# Patient Record
Sex: Female | Born: 1980 | Race: Black or African American | Hispanic: No | Marital: Married | Smoking: Never smoker
Health system: Southern US, Community
[De-identification: ages and names within clinical notes are randomized; demographics above are authoritative.]

---

## 2000-09-12 ENCOUNTER — Emergency Department (HOSPITAL_COMMUNITY): Admission: EM | Admit: 2000-09-12 | Discharge: 2000-09-12 | Payer: Self-pay | Admitting: Emergency Medicine

## 2002-01-05 ENCOUNTER — Emergency Department (HOSPITAL_COMMUNITY): Admission: EM | Admit: 2002-01-05 | Discharge: 2002-01-05 | Payer: Self-pay | Admitting: Emergency Medicine

## 2004-01-17 ENCOUNTER — Inpatient Hospital Stay (HOSPITAL_COMMUNITY): Admission: AD | Admit: 2004-01-17 | Discharge: 2004-01-17 | Payer: Self-pay | Admitting: Obstetrics & Gynecology

## 2004-01-18 ENCOUNTER — Inpatient Hospital Stay (HOSPITAL_COMMUNITY): Admission: AD | Admit: 2004-01-18 | Discharge: 2004-01-18 | Payer: Self-pay | Admitting: Obstetrics and Gynecology

## 2004-02-03 ENCOUNTER — Inpatient Hospital Stay (HOSPITAL_COMMUNITY): Admission: AD | Admit: 2004-02-03 | Discharge: 2004-02-03 | Payer: Self-pay | Admitting: *Deleted

## 2004-02-19 ENCOUNTER — Inpatient Hospital Stay (HOSPITAL_COMMUNITY): Admission: AD | Admit: 2004-02-19 | Discharge: 2004-02-19 | Payer: Self-pay | Admitting: Family Medicine

## 2004-09-08 ENCOUNTER — Inpatient Hospital Stay (HOSPITAL_COMMUNITY): Admission: AD | Admit: 2004-09-08 | Discharge: 2004-09-08 | Payer: Self-pay | Admitting: Obstetrics & Gynecology

## 2004-09-09 ENCOUNTER — Inpatient Hospital Stay (HOSPITAL_COMMUNITY): Admission: AD | Admit: 2004-09-09 | Discharge: 2004-09-13 | Payer: Self-pay | Admitting: Obstetrics and Gynecology

## 2004-09-10 ENCOUNTER — Encounter (INDEPENDENT_AMBULATORY_CARE_PROVIDER_SITE_OTHER): Payer: Self-pay | Admitting: *Deleted

## 2004-10-14 ENCOUNTER — Other Ambulatory Visit: Admission: RE | Admit: 2004-10-14 | Discharge: 2004-10-14 | Payer: Self-pay | Admitting: Obstetrics & Gynecology

## 2004-12-04 ENCOUNTER — Emergency Department (HOSPITAL_COMMUNITY): Admission: EM | Admit: 2004-12-04 | Discharge: 2004-12-05 | Payer: Self-pay | Admitting: Emergency Medicine

## 2007-06-16 ENCOUNTER — Emergency Department (HOSPITAL_COMMUNITY): Admission: EM | Admit: 2007-06-16 | Discharge: 2007-06-16 | Payer: Self-pay | Admitting: Emergency Medicine

## 2007-10-19 ENCOUNTER — Emergency Department (HOSPITAL_COMMUNITY): Admission: EM | Admit: 2007-10-19 | Discharge: 2007-10-19 | Payer: Self-pay | Admitting: Emergency Medicine

## 2009-10-26 ENCOUNTER — Emergency Department (HOSPITAL_COMMUNITY): Admission: EM | Admit: 2009-10-26 | Discharge: 2009-10-26 | Payer: Self-pay | Admitting: Emergency Medicine

## 2010-11-19 ENCOUNTER — Ambulatory Visit (HOSPITAL_COMMUNITY)
Admission: RE | Admit: 2010-11-19 | Discharge: 2010-11-19 | Payer: Self-pay | Source: Home / Self Care | Attending: Obstetrics and Gynecology | Admitting: Obstetrics and Gynecology

## 2010-12-14 ENCOUNTER — Ambulatory Visit: Payer: Self-pay | Admitting: Oncology

## 2010-12-30 LAB — CBC WITH DIFFERENTIAL/PLATELET
BASO%: 1 % (ref 0.0–2.0)
Basophils Absolute: 0.1 10*3/uL (ref 0.0–0.1)
EOS%: 2.7 % (ref 0.0–7.0)
Eosinophils Absolute: 0.2 10*3/uL (ref 0.0–0.5)
HCT: 24.8 % — ABNORMAL LOW (ref 34.8–46.6)
HGB: 6.6 g/dL — CL (ref 11.6–15.9)
LYMPH%: 41.6 % (ref 14.0–49.7)
MCH: 16.9 pg — ABNORMAL LOW (ref 25.1–34.0)
MCHC: 26.6 g/dL — ABNORMAL LOW (ref 31.5–36.0)
MCV: 63.6 fL — ABNORMAL LOW (ref 79.5–101.0)
MONO#: 0.6 10*3/uL (ref 0.1–0.9)
MONO%: 9.3 % (ref 0.0–14.0)
NEUT#: 2.7 10*3/uL (ref 1.5–6.5)
NEUT%: 45.4 % (ref 38.4–76.8)
Platelets: 372 10*3/uL (ref 145–400)
RBC: 3.9 10*6/uL (ref 3.70–5.45)
WBC: 5.9 10*3/uL (ref 3.9–10.3)
lymph#: 2.5 10*3/uL (ref 0.9–3.3)
nRBC: 0 % (ref 0–0)

## 2010-12-30 LAB — TECHNOLOGIST REVIEW

## 2010-12-30 LAB — CHCC SMEAR

## 2011-01-03 LAB — IRON AND TIBC
%SAT: 9 % — ABNORMAL LOW (ref 20–55)
Iron: 39 ug/dL — ABNORMAL LOW (ref 42–145)
TIBC: 419 ug/dL (ref 250–470)
UIBC: 380 ug/dL

## 2011-01-03 LAB — COMPREHENSIVE METABOLIC PANEL
ALT: 10 U/L (ref 0–35)
AST: 15 U/L (ref 0–37)
Albumin: 4.4 g/dL (ref 3.5–5.2)
Alkaline Phosphatase: 42 U/L (ref 39–117)
BUN: 7 mg/dL (ref 6–23)
CO2: 24 mEq/L (ref 19–32)
Calcium: 9.2 mg/dL (ref 8.4–10.5)
Chloride: 104 mEq/L (ref 96–112)
Creatinine, Ser: 0.67 mg/dL (ref 0.40–1.20)
Glucose, Bld: 93 mg/dL (ref 70–99)
Potassium: 4.2 mEq/L (ref 3.5–5.3)
Sodium: 139 mEq/L (ref 135–145)
Total Bilirubin: 0.2 mg/dL — ABNORMAL LOW (ref 0.3–1.2)
Total Protein: 7 g/dL (ref 6.0–8.3)

## 2011-01-03 LAB — HEMOGLOBINOPATHY EVALUATION
Hgb A: 98.1 % — ABNORMAL HIGH (ref 96.8–97.8)
Hgb F Quant: 0 % (ref 0.0–2.0)
Hgb S Quant: 0 % (ref 0.0–0.0)

## 2011-01-03 LAB — FERRITIN: Ferritin: 4 ng/mL — ABNORMAL LOW (ref 10–291)

## 2011-02-21 LAB — TYPE AND SCREEN
ABO/RH(D): A POS
Antibody Screen: NEGATIVE
Unit division: 0
Unit division: 0

## 2011-02-21 LAB — BASIC METABOLIC PANEL
BUN: 6 mg/dL (ref 6–23)
Chloride: 109 mEq/L (ref 96–112)
GFR calc Af Amer: >60 mL/min (ref >60)
GFR calc non Af Amer: >60 mL/min (ref >60)
Potassium: 4.7 mEq/L (ref 3.5–5.1)
Sodium: 137 mEq/L (ref 135–145)

## 2011-02-21 LAB — CBC
HCT: 13.9 % — ABNORMAL LOW (ref 36.0–46.0)
HCT: 14.1 % — ABNORMAL LOW (ref 36.0–46.0)
Hemoglobin: 3.8 g/dL — CL (ref 12.0–15.0)
MCH: 14.5 pg — ABNORMAL LOW (ref 26.0–34.0)
MCH: 14.6 pg — ABNORMAL LOW (ref 26.0–34.0)
MCHC: 27.2 g/dL — ABNORMAL LOW (ref 30.0–36.0)
MCHC: 27.4 g/dL — ABNORMAL LOW (ref 30.0–36.0)
MCV: 53.3 fL — ABNORMAL LOW (ref 78.0–100.0)
MCV: 53.7 fL — ABNORMAL LOW (ref 78.0–100.0)
Platelets: 593 10*3/uL — ABNORMAL HIGH (ref 150–400)
RBC: 2.6 MIL/uL — ABNORMAL LOW (ref 3.87–5.11)
RDW: 21.5 % — ABNORMAL HIGH (ref 11.5–15.5)
WBC: 7.3 10*3/uL (ref 4.0–10.5)

## 2011-02-21 LAB — PREGNANCY, URINE: Preg Test, Ur: NEGATIVE

## 2011-02-21 LAB — ABO/RH: ABO/RH(D): A POS

## 2011-03-16 LAB — POCT URINALYSIS DIP (DEVICE)
Bilirubin Urine: NEGATIVE
Ketones, ur: NEGATIVE mg/dL
Protein, ur: NEGATIVE mg/dL
Specific Gravity, Urine: 1.015 (ref 1.005–1.030)
pH: 7 (ref 5.0–8.0)

## 2011-04-29 NOTE — Op Note (Signed)
[name], [name]               ACCOUNT NO.:  [id number]   MEDICAL RECORD NO.:  [id number]          PATIENT TYPE:  INP   LOCATION:  9147                          FACILITY:  [facility]   PHYSICIAN:  Guy Sandifer. Tomblin II, M.D.DATE OF BIRTH:  [birth year]   DATE OF PROCEDURE:  09/10/2004  DATE OF DISCHARGE:                                 OPERATIVE REPORT   PREOPERATIVE DIAGNOSES:  1.  Intrauterine pregnancy at 44 1/2 weeks estimated gestational age.  2.  Arrest of descent.  3.  Nonreassuring fetal heart tracing.   POSTOPERATIVE DIAGNOSES:  1.  Intrauterine pregnancy at 76 1/2 weeks estimated gestational age.  2.  Arrest of descent.  3.  Nonreassuring fetal heart tracing.   PROCEDURE:  Low transverse cesarean section.   SURGEON:  Guy Sandifer. Henderson Cloud, M.D.   ANESTHESIA:  Epidural, Burnett Corrente, M.D.   ESTIMATED BLOOD LOSS:  800 mL.   SPECIMENS:  Uterus.   FINDINGS:  Viable female infant, Apgar's of 4 and 9 minutes respectively.  Birth weight 6 pounds 10 ounces, arterial cord pH 7.24.   INDICATIONS FOR PROCEDURE:  The patient is a 30 year old black female, G1,  P0 with an EDC of September 06, 2004 who presents with complaints of uterine  contractions.  Group B beta strep culture is positive. The patient's  admitted to labor and delivery, has an epidural placed. Spontaneous rupture  of membranes is noted for meconium stained fluid. Fetal heart tones are  reactive. The patient progresses to complete in pushing. Upon beginning the  second stage, there are some variable shaped decelerations down into the  50's to 60's range. However, there is a good recovery between and these  resolved with position change to patient left. Cervical examination revealed  complete dilation and ROT at zero station.  Upon resuming pushing again, it  was noted there is no progress and there is recurrence of variable  decelerations again down into the 50's and 60's some with suspect late  components. A  recommendation for cesarean section is made. The potential  risks and complications are discussed with the patient including but not  limited to infection, bowel, bladder, ureteral damage, bleeding requiring  transfusion of blood products and possible transfusion reaction, HIV and  hepatitis acquisition, DVT, PE and pneumonia. All questions are answered and  consent is signed on the chart.   DESCRIPTION OF PROCEDURE:  The patient was taken to the operating room where  she was identified, epidural anesthetic is augmented to the surgical level  and she is placed in the dorsal supine position with a 15 degree left  lateral wedge. She is then prepped, there is a Foley catheter in the bladder  and she is draped in a sterile fashion. After adjusting for adequate  epidural anesthesia, the skin is entered through a Pfannenstiel incision and  dissection is carried out in layers to the peritoneum. The peritoneum is  incised and extended superiorly and inferiorly. The vesicouterine peritoneum  is taken down cephalolaterally, bladder flap is developed and bladder blade  is placed.  The uterus is incised in a  low transverse manner. The uterine  cavity is entered bluntly with a hemostat. The uterine incision is extended  cephalolaterally with the fingers.  The vertex is then delivered. Oral  nasopharynx are thoroughly suctioned with DeLee suction. Nuchal cord x2 is  reduced. The baby is then delivered, cord is clamped and cut and the baby is  handed to the waiting pediatric's team. The placenta is manually delivered  and sent to pathology. The uterine cavity is cleaned. The uterus is closed  in a running locking layer of #0 Monocryl and an imbricating layer of #0  Monocryl.  Good hemostasis is noted. The tubes and ovaries are normal. The  anterior peritoneum is closed in a running fashion with #0 Monocryl which is  also used to reapproximate the pyramidalis muscle in the midline. The  anterior rectus  fascia is closed with #0 PDS starting at each angle and  meeting in the middle. All counts correct. The patient is taken to the  recovery room in stable condition.      JET/MEDQ  D:  09/10/2004  T:  09/10/2004  Job:  [id number]

## 2011-04-29 NOTE — Discharge Summary (Signed)
[name], [name]               ACCOUNT NO.:  [id number]   MEDICAL RECORD NO.:  [id number]          PATIENT TYPE:  INP   LOCATION:  9147                          FACILITY:  [facility]   PHYSICIAN:  Julio Sicks, N.P.     DATE OF BIRTH:  1981-11-26   DATE OF ADMISSION:  09/09/2004  DATE OF DISCHARGE:  09/13/2004                                 DISCHARGE SUMMARY   ADMISSION DIAGNOSES:  1.  Intrauterine pregnancy at 40-1/2 weeks estimated gestational age.  2.  Spontaneous onset of labor.   DISCHARGE DIAGNOSES:  1.  Status post low transverse cesarean section secondary to non reassuring      fetal heart tones and arrest of descent.  2.  Viable female infant.   PROCEDURE:  Primary low transverse cesarean section.   REASON FOR ADMISSION:  Please see written H&P.   HOSPITAL COURSE:  The patient was a 30 year old, black female, primigravida  who was admitted to [facility] with the onset of uterine  contractions.  The patient was noted to have positive group B Streptococcus  culture.  The patient was admitted to Labor & Delivery where an epidural was  placed.  The patient did have spontaneous rupture of membranes which was  noted with a meconium stained fluid.  Fetal heart tones were reactive and IV  antibiotics were given prophylactically.  The patient did progress to  complete dilatation.  At the beginning of the second stage, the patient was  noted to have some variable decelerations in the fetal heart tones down to  the 50's and 60's with pushing.  It was noticed that the fetal heart tones  did have good recovery in between and these did resolve with position  change.  However, after further progression to the second stage, there was  no further descent of the fetal vertex.  Fetal heart tones continued to have  some decelerations down into the 50's and 60's with some suspect late  components.  The decision was made to proceed with the cesarean delivery.  The patient was  then transferred to the operating room where epidural was  dosed to an adequate surgical level.  A low transverse incision was made  with the delivery of a viable female infant weighing six pounds 10 ounces with  Apgar's of 4 at 1 minute and 9 at 5 minutes with a cord pH of 7.24.  The  patient tolerated the procedure well, and was taken to the recovery room in  stable condition.  0n postoperative day #1, the patient was without  complaint.  Vital signs were stable.  She was afebrile.  Her fundus was firm  and mildly tender.  Abdominal dressing was noted to be clean, dry, and  intact.  Labs revealed a hemoglobin of 9.1.  On postoperative day #2, the  patient was without complaint other than some mild uterine tenderness;  however, this seemed to be improved from the previous day.  Vital signs  remained stable.  Temperature was 99.1.  The fundus was firm, slightly  tender, and the abdominal dressing had been removed revealing  an incision  that was clean, dry, and intact.  On postoperative day #3, vital signs were  stable.  She was afebrile.  The fundus was firm and nontender.  Her incision  was clean, dry, and intact.  Her staples were removed and the patient was  discharged home.   CONDITION ON DISCHARGE:  Good.   DIET:  Regular, as tolerated.   DISCHARGE ACTIVITIES:  No heavy lifting.  No driving x2 weeks.  No vaginal  injury.   FOLLOW UP:  The patient is to followup in the office in 1-2 weeks for an  incision check.   She is to call for a temperature greater than 100 degrees, persistent nausea  or vomiting, heavy vaginal bleeding and/or redness or drainage from the  incisional site.   DISCHARGE MEDICATIONS:  1.  Tylox #30 one p.o. every 4-6 hours p.r.n.  2.  Motrin 600 mg every six hours.  3.  Colace one p.o. daily p.r.n.      CC/MEDQ  D:  09/30/2004  T:  09/30/2004  Job:  [id number]

## 2011-09-20 LAB — POCT URINALYSIS DIP (DEVICE)
Glucose, UA: NEGATIVE
Ketones, ur: NEGATIVE
Operator id: 255721
Protein, ur: NEGATIVE
Specific Gravity, Urine: 1.015
Urobilinogen, UA: 0.2

## 2011-09-20 LAB — POCT PREGNANCY, URINE: Operator id: 200941

## 2011-09-20 LAB — URINE CULTURE: Colony Count: NO GROWTH

## 2013-11-26 ENCOUNTER — Other Ambulatory Visit: Payer: Self-pay | Admitting: Family Medicine

## 2013-11-29 ENCOUNTER — Other Ambulatory Visit: Payer: Managed Care, Other (non HMO)

## 2013-12-03 ENCOUNTER — Ambulatory Visit
Admission: RE | Admit: 2013-12-03 | Discharge: 2013-12-03 | Disposition: A | Discharge status: Home / Self Care | Payer: Managed Care, Other (non HMO) | Source: Ambulatory Visit | Attending: Family Medicine | Admitting: Family Medicine

## 2018-01-03 ENCOUNTER — Ambulatory Visit (INDEPENDENT_AMBULATORY_CARE_PROVIDER_SITE_OTHER): Payer: Worker's Compensation | Admitting: Family Medicine

## 2018-01-03 ENCOUNTER — Ambulatory Visit (INDEPENDENT_AMBULATORY_CARE_PROVIDER_SITE_OTHER): Payer: Worker's Compensation

## 2018-01-03 ENCOUNTER — Encounter: Payer: Self-pay | Admitting: Family Medicine

## 2018-01-03 ENCOUNTER — Other Ambulatory Visit: Payer: Self-pay

## 2018-01-03 VITALS — BP 153/120 | HR 88 | Temp 98.7°F | Resp 17 | Ht 64.0 in | Wt 174.0 lb

## 2018-01-03 DIAGNOSIS — R0781 Pleurodynia: Secondary | ICD-10-CM | POA: Diagnosis not present

## 2018-01-03 DIAGNOSIS — S20212A Contusion of left front wall of thorax, initial encounter: Secondary | ICD-10-CM

## 2018-01-03 DIAGNOSIS — Y990 Civilian activity done for income or pay: Secondary | ICD-10-CM | POA: Diagnosis not present
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
## 2018-01-03 NOTE — Patient Instructions (Signed)
     IF you received an x-ray today, you will receive an invoice from [facility]. Please contact [facility] at [phone] with questions or concerns regarding your invoice.   IF you received labwork today, you will receive an invoice from [facility]. Please contact [facility] at [phone] with questions or concerns regarding your invoice.   Our billing staff will not be able to assist you with questions regarding bills from these companies.  You will be contacted with the lab results as soon as they are available. The fastest way to get your results is to activate your My Chart account. Instructions are located on the last page of this paperwork. If you have not heard from us regarding the results in 2 weeks, please contact this office.     

## 2018-01-04 ENCOUNTER — Encounter: Payer: Self-pay | Admitting: Family Medicine

## 2018-01-04 ENCOUNTER — Telehealth: Payer: Self-pay | Admitting: Family Medicine

## 2018-01-04 NOTE — Telephone Encounter (Signed)
Copied from CRM [id number]. Topic: Quick Communication - Rx Refill/Question >> Jan 04, 2018  1:35 PM Stephannie LiSimmons, Inger Wiest L, NT wrote: Medication: Ibuprofen Has the patient contacted their pharmacy? {yes  (Agent: If no, request that the patient contact the pharmacy for the refill.) Preferred Pharmacy (with phone number or street name): [facility] [address]  [place] [phone] Agent: Please be advised that RX refills may take up to 3 business days. We ask that you follow-up with your pharmacy.

## 2018-01-04 NOTE — Telephone Encounter (Signed)
Please advise on this refill for ibuprofen for this patient

## 2018-01-05 ENCOUNTER — Telehealth: Payer: Self-pay | Admitting: Family Medicine

## 2018-01-05 NOTE — Telephone Encounter (Signed)
Copied from CRM [id number]. Topic: Appointment Scheduling - Scheduling Inquiry for Clinic >> Jan 05, 2018  8:15 AM Landry MellowFoltz, Melissa J wrote: Reason for CRM: pt would like to now when her next follow up appt should be. Please call [phone]

## 2018-01-05 NOTE — Telephone Encounter (Signed)
Windell MouldingRuth from [employer] [facility] is calling to get clinical notes and physical therapy order and work notes to them to get them to approve pt' workers comp.Windell Moulding. Ruth stated she has been trying to get this information so they can move forward with the process but in order for that to happen the notes have to be completed..   Please advise

## 2018-01-07 ENCOUNTER — Encounter: Payer: Self-pay | Admitting: Family Medicine

## 2018-01-08 ENCOUNTER — Encounter: Payer: Self-pay | Admitting: Family Medicine

## 2018-01-08 NOTE — Telephone Encounter (Signed)
Faxed clinical info to Windell MouldingRuth from [employer] to [phone].

## 2018-01-08 NOTE — Telephone Encounter (Signed)
[employer] [facility] is calling back regarding this, [phone].

## 2018-01-08 NOTE — Telephone Encounter (Signed)
[employer] [facility] called and they are needing the drug screen results, phone if needed: [phone]  Fax: [phone]

## 2018-01-10 MED ORDER — IBUPROFEN 600 MG PO TABS: 600.0000 mg | ORAL_TABLET | Freq: Three times a day (TID) | ORAL | 0 refills | Status: AC | PRN

## 2018-01-10 NOTE — Telephone Encounter (Signed)
I did not do a drug screen. Please notify the company. If they need one then please have the patient come back to get a drug screen.

## 2018-01-10 NOTE — Progress Notes (Signed)
  Chief Complaint  Patient presents with  . workman's comp    followup    HPI  Patient reports that she completed a drug screen at her initial visit 01/03/2018 She is having continued side pains worse on the left side and it makes coughing painful with difficulty moving her left arm.  She has not started physical therapy as yet.  No past medical history on file.  Current medications reviewed and in chart   Allergies: No Known Allergies  No past surgical history on file.  Social history reviewed and noted in chart  No family history on file.   ROS Review of Systems See HPI Constitution: No fevers or chills No malaise No diaphoresis Skin: No rash or itching Eyes: no blurry vision, no double vision GU: no dysuria or hematuria Neuro: no dizziness or headaches all others reviewed and negative   Objective: Vitals:   01/11/18 1625 01/11/18 1724  BP: (!) 170/98 (!) 156/107  Pulse: 93   Resp: 16   Temp: 98.4 F (36.9 C)   TempSrc: Oral   SpO2: 99%   Weight: 176 lb 3.2 oz (79.9 kg)   Height: 5\' 4"  (1.626 m)     Physical Exam  Constitutional: She appears well-developed and well-nourished.  HENT:  Head: Normocephalic and atraumatic.  Eyes: Conjunctivae and EOM are normal.  Cardiovascular: Normal rate, regular rhythm and normal heart sounds.  Pulmonary/Chest: Effort normal and breath sounds normal. No stridor. No respiratory distress. She exhibits tenderness.      Assessment and Plan [name] was seen today for workman's comp.  Diagnoses and all orders for this visit:  Rib pain on left side Contusion of rib on left side, subsequent encounter  Continue nsaids and physical therapy  -     diclofenac sodium (VOLTAREN) 1 % GEL; Apply 2 g topically 4 (four) times daily.     [name] A [name]

## 2018-01-11 ENCOUNTER — Other Ambulatory Visit: Payer: Self-pay

## 2018-01-11 ENCOUNTER — Encounter: Payer: Self-pay | Admitting: Family Medicine

## 2018-01-11 ENCOUNTER — Ambulatory Visit (INDEPENDENT_AMBULATORY_CARE_PROVIDER_SITE_OTHER): Payer: Worker's Compensation | Admitting: Family Medicine

## 2018-01-11 VITALS — BP 156/107 | HR 93 | Temp 98.4°F | Resp 16 | Ht 64.0 in | Wt 176.2 lb

## 2018-01-11 DIAGNOSIS — S20212D Contusion of left front wall of thorax, subsequent encounter: Secondary | ICD-10-CM

## 2018-01-11 DIAGNOSIS — R0781 Pleurodynia: Secondary | ICD-10-CM | POA: Diagnosis not present

## 2018-01-11 MED ORDER — DICLOFENAC SODIUM 1 % TD GEL: 2.0000 g | Freq: Four times a day (QID) | TRANSDERMAL | 1 refills | Status: AC

## 2018-01-11 NOTE — Telephone Encounter (Signed)
Spoke with [place] at [facility] Relayed no drug screen completed.

## 2018-01-11 NOTE — Patient Instructions (Signed)
     IF you received an x-ray today, you will receive an invoice from [facility]. Please contact  Radiology at [phone] with questions or concerns regarding your invoice.   IF you received labwork today, you will receive an invoice from [facility]. Please contact [facility] at [phone] with questions or concerns regarding your invoice.   Our billing staff will not be able to assist you with questions regarding bills from these companies.  You will be contacted with the lab results as soon as they are available. The fastest way to get your results is to activate your My Chart account. Instructions are located on the last page of this paperwork. If you have not heard from us regarding the results in 2 weeks, please contact this office.     

## 2018-01-15 ENCOUNTER — Telehealth: Payer: Self-pay | Admitting: Family Medicine

## 2018-01-15 NOTE — Telephone Encounter (Signed)
Called and left message for pt w/c adjustor Gaetana MichaelisJohanna Snyder to let her know about pt physical therapy referral.. Left a message for her to call back

## 2018-01-17 NOTE — Telephone Encounter (Signed)
Follow up completed.

## 2018-01-18 NOTE — Telephone Encounter (Signed)
Sheana with [facility] called to ask for the order/referral be faxed to them at  Fax [phone]  Ref no: [id number]  They assist the pt with getting appt

## 2018-01-19 NOTE — Telephone Encounter (Signed)
[facility].
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
## 2023-04-12 ENCOUNTER — Ambulatory Visit: Payer: Managed Care, Other (non HMO) | Admitting: Family Medicine

## 2023-12-01 ENCOUNTER — Other Ambulatory Visit (HOSPITAL_BASED_OUTPATIENT_CLINIC_OR_DEPARTMENT_OTHER): Payer: Self-pay

## 2023-12-01 ENCOUNTER — Emergency Department (HOSPITAL_BASED_OUTPATIENT_CLINIC_OR_DEPARTMENT_OTHER): Payer: BC Managed Care – PPO

## 2023-12-01 ENCOUNTER — Encounter (HOSPITAL_BASED_OUTPATIENT_CLINIC_OR_DEPARTMENT_OTHER): Payer: Self-pay | Admitting: Emergency Medicine

## 2023-12-01 ENCOUNTER — Other Ambulatory Visit: Payer: Self-pay

## 2023-12-01 ENCOUNTER — Emergency Department (HOSPITAL_BASED_OUTPATIENT_CLINIC_OR_DEPARTMENT_OTHER)
Admission: EM | Admit: 2023-12-01 | Discharge: 2023-12-01 | Disposition: A | Discharge status: Home / Self Care | Payer: BC Managed Care – PPO | Attending: Emergency Medicine | Admitting: Emergency Medicine

## 2023-12-01 DIAGNOSIS — N3000 Acute cystitis without hematuria: Secondary | ICD-10-CM | POA: Insufficient documentation

## 2023-12-01 DIAGNOSIS — R002 Palpitations: Secondary | ICD-10-CM | POA: Insufficient documentation

## 2023-12-01 LAB — COMPREHENSIVE METABOLIC PANEL
ALT: 13 U/L (ref 0–44)
AST: 16 U/L (ref 15–41)
Albumin: 4 g/dL (ref 3.5–5.0)
Alkaline Phosphatase: 39 U/L (ref 38–126)
Anion gap: 7 (ref 5–15)
BUN: 10 mg/dL (ref 6–20)
CO2: 23 mmol/L (ref 22–32)
Calcium: 8.7 mg/dL — ABNORMAL LOW (ref 8.9–10.3)
Chloride: 108 mmol/L (ref 98–111)
Creatinine, Ser: 0.81 mg/dL (ref 0.44–1.00)
GFR, Estimated: >60 mL/min (ref >60)
Glucose, Bld: 97 mg/dL (ref 70–99)
Potassium: 3.8 mmol/L (ref 3.5–5.1)
Sodium: 138 mmol/L (ref 135–145)
Total Bilirubin: 0.7 mg/dL (ref <1.2)
Total Protein: 7.4 g/dL (ref 6.5–8.1)

## 2023-12-01 LAB — MAGNESIUM: Magnesium: 2 mg/dL (ref 1.7–2.4)

## 2023-12-01 LAB — CBG MONITORING, ED: Glucose-Capillary: 100 mg/dL — ABNORMAL HIGH (ref 70–99)

## 2023-12-01 LAB — CBC WITH DIFFERENTIAL/PLATELET
Abs Immature Granulocytes: 0.01 10*3/uL (ref 0.00–0.07)
Basophils Absolute: 0 10*3/uL (ref 0.0–0.1)
Basophils Relative: 1 %
Eosinophils Absolute: 0 10*3/uL (ref 0.0–0.5)
Eosinophils Relative: 0 %
HCT: 41.1 % (ref 36.0–46.0)
Hemoglobin: 14.2 g/dL (ref 12.0–15.0)
Immature Granulocytes: 0 %
Lymphocytes Relative: 30 %
Lymphs Abs: 1.8 10*3/uL (ref 0.7–4.0)
MCH: 30.7 pg (ref 26.0–34.0)
MCHC: 34.5 g/dL (ref 30.0–36.0)
MCV: 89 fL (ref 80.0–100.0)
Monocytes Absolute: 0.4 10*3/uL (ref 0.1–1.0)
Monocytes Relative: 6 %
Neutro Abs: 3.8 10*3/uL (ref 1.7–7.7)
Neutrophils Relative %: 63 %
Platelets: 237 10*3/uL (ref 150–400)
RBC: 4.62 MIL/uL (ref 3.87–5.11)
RDW: 13 % (ref 11.5–15.5)
WBC: 6 10*3/uL (ref 4.0–10.5)
nRBC: 0 % (ref 0.0–0.2)

## 2023-12-01 LAB — URINALYSIS, ROUTINE W REFLEX MICROSCOPIC
Bilirubin Urine: NEGATIVE
Glucose, UA: NEGATIVE mg/dL
Ketones, ur: NEGATIVE mg/dL
Leukocytes,Ua: NEGATIVE
Nitrite: POSITIVE — AB
Protein, ur: NEGATIVE mg/dL
Specific Gravity, Urine: 1.02 (ref 1.005–1.030)
pH: 6 (ref 5.0–8.0)

## 2023-12-01 LAB — PREGNANCY, URINE: Preg Test, Ur: NEGATIVE

## 2023-12-01 LAB — URINALYSIS, MICROSCOPIC (REFLEX)

## 2023-12-01 LAB — TSH: TSH: 0.682 u[IU]/mL (ref 0.350–4.500)

## 2023-12-01 MED ORDER — CEPHALEXIN 250 MG PO CAPS
250.0000 mg | ORAL_CAPSULE | Freq: Four times a day (QID) | ORAL | 0 refills | Status: AC
  Filled 2023-12-01: qty 20, 5d supply, fill #0

## 2023-12-01 NOTE — ED Provider Notes (Signed)
 [facility] Provider Note   CSN: [id number] Arrival date & time: 12/01/23  3664     History Chief Complaint  Patient presents with   Palpitations    Numbness left hand    [name] is a 42 y.o. female.  Patient with no significant past medical history presents to the emergency department concerns of palpitations.  She reports that she has been experiencing palpitations and some left arm tingling for the last 2 days or so.  Denies any significant chest pain or shortness of breath.  No history of any cardiac abnormalities such as heart arrhythmias, or structural heart defects.  She is not on any blood thinners or any hypertensive medication.  Based on chart review, patient has a history of panic disorders and this typically only has aggravation about twice a year.  She is not on any medication for anxiety or depression.   Palpitations      Home Medications Prior to Admission medications   Medication Sig Start Date End Date Taking? Authorizing Provider  cephALEXin (KEFLEX) 250 MG capsule Take 1 capsule (250 mg total) by mouth 4 (four) times daily for 5 days. 12/01/23 12/06/23 Yes Smitty Knudsen, PA-C  diclofenac sodium (VOLTAREN) 1 % GEL Apply 2 g topically 4 (four) times daily. 01/11/18   Doristine Bosworth, MD  ibuprofen (ADVIL,MOTRIN) 600 MG tablet Take 1 tablet (600 mg total) by mouth every 8 (eight) hours as needed. 01/10/18   Doristine Bosworth, MD  Ibuprofen-Diphenhydramine HCl 200-25 MG CAPS Take 200 mg by mouth.    [provider]  Norethindrone Acetate-Ethinyl Estrad-FE (JUNEL FE 24) 1-20 MG-MCG(24) tablet Take 1 tablet by mouth daily.    [provider]      Allergies    Patient has no known allergies.    Review of Systems   Review of Systems  Cardiovascular:  Positive for palpitations.  All other systems reviewed and are negative.   Physical Exam Updated Vital Signs BP (!) 161/100   Pulse 72   Temp  98.8 F (37.1 C) (Oral)   Resp 20   Ht 5\' 4"  (1.626 m)   Wt 80.7 kg   LMP 11/13/2023   SpO2 99%   BMI 30.55 kg/m  Physical Exam Vitals and nursing note reviewed.  Constitutional:      General: She is not in acute distress.    Appearance: She is well-developed.  HENT:     Head: Normocephalic and atraumatic.  Eyes:     Conjunctiva/sclera: Conjunctivae normal.  Cardiovascular:     Rate and Rhythm: Normal rate and regular rhythm.     Heart sounds: No murmur heard. Pulmonary:     Effort: Pulmonary effort is normal. No respiratory distress.     Breath sounds: Normal breath sounds.  Abdominal:     Palpations: Abdomen is soft.     Tenderness: There is no abdominal tenderness.  Musculoskeletal:        General: No swelling.     Cervical back: Neck supple.  Skin:    General: Skin is warm and dry.     Capillary Refill: Capillary refill takes less than 2 seconds.  Neurological:     Mental Status: She is alert.  Psychiatric:        Mood and Affect: Mood normal.     ED Results / Procedures / Treatments   Labs (all labs ordered are listed, but only abnormal results are displayed) Labs Reviewed  COMPREHENSIVE  METABOLIC PANEL - Abnormal; Notable for the following components:      Result Value   Calcium 8.7 (*)    All other components within normal limits  URINALYSIS, ROUTINE W REFLEX MICROSCOPIC - Abnormal; Notable for the following components:   Color, Urine ORANGE (*)    Hgb urine dipstick MODERATE (*)    Nitrite POSITIVE (*)    All other components within normal limits  URINALYSIS, MICROSCOPIC (REFLEX) - Abnormal; Notable for the following components:   Bacteria, UA MANY (*)    All other components within normal limits  CBG MONITORING, ED - Abnormal; Notable for the following components:   Glucose-Capillary 100 (*)    All other components within normal limits  CBC WITH DIFFERENTIAL/PLATELET  MAGNESIUM  PREGNANCY, URINE  TSH    EKG EKG  Interpretation Date/Time:  Friday December 01 2023 09:45:10 EST Ventricular Rate:  81 PR Interval:  234 QRS Duration:  95 QT Interval:  388 QTC Calculation: 451 R Axis:   -50  Text Interpretation: Sinus rhythm Prolonged PR interval Left anterior fascicular block Abnormal R-wave progression, late transition Borderline T abnormalities, diffuse leads Confirmed by Fulton Reek [phone]) on 12/01/2023 9:54:45 AM  Radiology DG Chest 2 View Result Date: 12/01/2023 CLINICAL DATA:  Chest pain, cough. EXAM: CHEST - 2 VIEW COMPARISON:  January 03, 2018. FINDINGS: The heart size and mediastinal contours are within normal limits. Both lungs are clear. The visualized skeletal structures are unremarkable. IMPRESSION: No active cardiopulmonary disease. Electronically Signed   By: Lupita Raider M.D.   On: 12/01/2023 10:54    Procedures Procedures   Medications Ordered in ED Medications - No data to display  ED Course/ Medical Decision Making/ A&P                                 Medical Decision Making Amount and/or Complexity of Data Reviewed Labs: ordered. Radiology: ordered.   This patient presents to the ED for concern of palpitations.  Differential diagnosis includes anxiety, hyperthyroidism, heart arrhythmia, PE   Lab Tests:  I Ordered, and personally interpreted labs.  The pertinent results include: POC CBG is 100, CBC unremarkable, CMP without evidence of AKI or other abnormality.  Magnesium normal at 2.0, urine pregnancy negative, urinalysis signs of infection with moderate hemoglobin, nitrites and bacteria seen.   Imaging Studies ordered:  I ordered imaging studies including chest x-ray I independently visualized and interpreted imaging which showed no acute cardiopulmonary process I agree with the radiologist interpretation   Problem List / ED Course:  Patient presents the emergency department concerns of palpitations.  States that she has been experiencing palpitations  and some left arm tingling for the last 2 days or so.  States that the symptoms are not typical for her although has a history of anxiety but has not had symptoms similar to this.  Denies any chest pain, shortness of breath, weakness, lightheadedness, dizziness, or syncope.  She is not currently on any medications from what she has reported to me.  Blood pressure is notably elevated but states that she is not on any blood pressure medicine.  On chart review, patient was seen by PCP a few days ago and concern for hypertension or mention but patient is opting to try to manage symptoms with lifestyle modifications.  At this time, broad lab workup initiated for possible assessment of the symptoms.  EKG is unremarkable showing normal sinus rhythm with  some slight PR prolongation but no evidence of heart arrhythmia. Patient's labs are reassuring today.  No acute electrolyte abnormality, and prior TSH checked by PCP is unremarkable.  I suspect patient's symptoms are possibly functional in nature and potentially anxiety related.  Patient does report that she had some palpitations during an episode of anxiety earlier this week.  Patient's urine is concerning for UTI so we will treat accordingly. Informed patient of reassuring lab workup but need for UTI treatment.  Prescription for cephalexin to patient's pharmacy for treatment of this UTI.  Discussed return precautions and encouraged patient to follow-up with PCP for further evaluation.  Strict return precautions discussed.  Final Clinical Impression(s) / ED Diagnoses Final diagnoses:  Palpitations  Acute cystitis without hematuria    Rx / DC Orders ED Discharge Orders          Ordered    cephALEXin (KEFLEX) 250 MG capsule  4 times daily        12/01/23 1441              Salomon Mast 12/01/23 1444    Laurence Spates, MD 12/02/23 1204

## 2023-12-01 NOTE — Discharge Instructions (Signed)
You are seen in the emergency department today with concerns of palpitations.  Your labs and imaging are reassuring.  Your TSH level still pending although your recent TSH provided by your primary care provider was unremarkable and normal.  Your urine does have signs of infection so a prescription for Keflex was sent to your pharmacy.  I would recommend returning emergency department if you have concerning or worsening symptoms.  Otherwise please follow-up with your primary care provider.

## 2023-12-01 NOTE — ED Triage Notes (Signed)
States has been having palpations and left arm numbness x 2 days. Was eval by PCP on Wed
